# Patient Record
Sex: Male | Born: 1960 | Race: Black or African American | Hispanic: No | Smoking: Never smoker
Health system: Southern US, Community
[De-identification: ages and names within clinical notes are randomized; demographics above are authoritative.]

## PROBLEM LIST (undated history)

## (undated) DIAGNOSIS — I10 Essential (primary) hypertension: Secondary | ICD-10-CM

---

## 1998-06-29 ENCOUNTER — Encounter: Payer: Self-pay | Admitting: Emergency Medicine

## 1998-06-29 ENCOUNTER — Emergency Department (HOSPITAL_COMMUNITY): Admission: EM | Admit: 1998-06-29 | Discharge: 1998-06-29 | Payer: Self-pay | Admitting: Emergency Medicine

## 2003-10-19 ENCOUNTER — Emergency Department (HOSPITAL_COMMUNITY): Admission: EM | Admit: 2003-10-19 | Discharge: 2003-10-19 | Payer: Self-pay | Admitting: Emergency Medicine

## 2007-03-02 ENCOUNTER — Emergency Department (HOSPITAL_COMMUNITY): Admission: EM | Admit: 2007-03-02 | Discharge: 2007-03-02 | Payer: Self-pay | Admitting: Emergency Medicine

## 2009-04-15 ENCOUNTER — Emergency Department (HOSPITAL_COMMUNITY): Admission: EM | Admit: 2009-04-15 | Discharge: 2009-04-15 | Payer: Self-pay | Admitting: Emergency Medicine

## 2011-05-08 DIAGNOSIS — J209 Acute bronchitis, unspecified: Secondary | ICD-10-CM | POA: Diagnosis not present

## 2011-05-08 DIAGNOSIS — R0989 Other specified symptoms and signs involving the circulatory and respiratory systems: Secondary | ICD-10-CM | POA: Diagnosis not present

## 2011-05-08 DIAGNOSIS — R05 Cough: Secondary | ICD-10-CM | POA: Diagnosis not present

## 2011-11-06 DIAGNOSIS — J40 Bronchitis, not specified as acute or chronic: Secondary | ICD-10-CM | POA: Diagnosis not present

## 2011-11-06 DIAGNOSIS — J209 Acute bronchitis, unspecified: Secondary | ICD-10-CM | POA: Diagnosis not present

## 2012-07-06 DIAGNOSIS — K3189 Other diseases of stomach and duodenum: Secondary | ICD-10-CM | POA: Diagnosis not present

## 2012-07-06 DIAGNOSIS — Z7982 Long term (current) use of aspirin: Secondary | ICD-10-CM | POA: Diagnosis not present

## 2012-07-30 DIAGNOSIS — K219 Gastro-esophageal reflux disease without esophagitis: Secondary | ICD-10-CM | POA: Diagnosis not present

## 2012-07-31 DIAGNOSIS — K219 Gastro-esophageal reflux disease without esophagitis: Secondary | ICD-10-CM | POA: Diagnosis not present

## 2012-08-14 DIAGNOSIS — I10 Essential (primary) hypertension: Secondary | ICD-10-CM | POA: Diagnosis not present

## 2012-08-14 DIAGNOSIS — R109 Unspecified abdominal pain: Secondary | ICD-10-CM | POA: Diagnosis not present

## 2012-08-14 DIAGNOSIS — Z125 Encounter for screening for malignant neoplasm of prostate: Secondary | ICD-10-CM | POA: Diagnosis not present

## 2012-09-05 DIAGNOSIS — H6120 Impacted cerumen, unspecified ear: Secondary | ICD-10-CM | POA: Diagnosis not present

## 2012-09-05 DIAGNOSIS — H9319 Tinnitus, unspecified ear: Secondary | ICD-10-CM | POA: Diagnosis not present

## 2012-09-12 DIAGNOSIS — K219 Gastro-esophageal reflux disease without esophagitis: Secondary | ICD-10-CM | POA: Diagnosis not present

## 2012-09-20 DIAGNOSIS — L570 Actinic keratosis: Secondary | ICD-10-CM | POA: Diagnosis not present

## 2012-09-20 DIAGNOSIS — L980 Pyogenic granuloma: Secondary | ICD-10-CM | POA: Diagnosis not present

## 2013-02-11 DIAGNOSIS — H60399 Other infective otitis externa, unspecified ear: Secondary | ICD-10-CM | POA: Diagnosis not present

## 2013-09-10 DIAGNOSIS — K2970 Gastritis, unspecified, without bleeding: Secondary | ICD-10-CM | POA: Diagnosis not present

## 2013-09-10 DIAGNOSIS — K219 Gastro-esophageal reflux disease without esophagitis: Secondary | ICD-10-CM | POA: Diagnosis not present

## 2013-10-08 DIAGNOSIS — R079 Chest pain, unspecified: Secondary | ICD-10-CM | POA: Diagnosis not present

## 2013-10-08 DIAGNOSIS — G8929 Other chronic pain: Secondary | ICD-10-CM | POA: Diagnosis not present

## 2013-10-08 DIAGNOSIS — Z7982 Long term (current) use of aspirin: Secondary | ICD-10-CM | POA: Diagnosis not present

## 2013-10-08 DIAGNOSIS — Z79899 Other long term (current) drug therapy: Secondary | ICD-10-CM | POA: Diagnosis not present

## 2013-10-08 DIAGNOSIS — M549 Dorsalgia, unspecified: Secondary | ICD-10-CM | POA: Diagnosis not present

## 2013-10-08 DIAGNOSIS — I209 Angina pectoris, unspecified: Secondary | ICD-10-CM | POA: Diagnosis not present

## 2013-10-08 DIAGNOSIS — K219 Gastro-esophageal reflux disease without esophagitis: Secondary | ICD-10-CM | POA: Diagnosis not present

## 2013-10-09 DIAGNOSIS — K219 Gastro-esophageal reflux disease without esophagitis: Secondary | ICD-10-CM | POA: Diagnosis not present

## 2013-10-09 DIAGNOSIS — R079 Chest pain, unspecified: Secondary | ICD-10-CM | POA: Diagnosis not present

## 2013-10-09 DIAGNOSIS — G8929 Other chronic pain: Secondary | ICD-10-CM | POA: Diagnosis not present

## 2013-10-09 DIAGNOSIS — M549 Dorsalgia, unspecified: Secondary | ICD-10-CM | POA: Diagnosis not present

## 2013-10-09 DIAGNOSIS — I209 Angina pectoris, unspecified: Secondary | ICD-10-CM | POA: Diagnosis not present

## 2013-11-20 DIAGNOSIS — R079 Chest pain, unspecified: Secondary | ICD-10-CM | POA: Diagnosis not present

## 2013-11-20 DIAGNOSIS — Z7982 Long term (current) use of aspirin: Secondary | ICD-10-CM | POA: Diagnosis not present

## 2013-11-20 DIAGNOSIS — R0789 Other chest pain: Secondary | ICD-10-CM | POA: Diagnosis not present

## 2013-12-10 DIAGNOSIS — E782 Mixed hyperlipidemia: Secondary | ICD-10-CM | POA: Diagnosis not present

## 2013-12-10 DIAGNOSIS — I674 Hypertensive encephalopathy: Secondary | ICD-10-CM | POA: Diagnosis not present

## 2013-12-10 DIAGNOSIS — Z131 Encounter for screening for diabetes mellitus: Secondary | ICD-10-CM | POA: Diagnosis not present

## 2013-12-26 DIAGNOSIS — R1013 Epigastric pain: Secondary | ICD-10-CM | POA: Diagnosis not present

## 2013-12-26 DIAGNOSIS — R0789 Other chest pain: Secondary | ICD-10-CM | POA: Diagnosis not present

## 2014-02-10 ENCOUNTER — Encounter (HOSPITAL_COMMUNITY): Payer: Self-pay | Admitting: *Deleted

## 2014-02-10 ENCOUNTER — Emergency Department (HOSPITAL_COMMUNITY)
Admission: EM | Admit: 2014-02-10 | Discharge: 2014-02-11 | Disposition: A | Discharge status: Home / Self Care | Payer: Medicare Other | Attending: Emergency Medicine | Admitting: Emergency Medicine

## 2014-02-10 DIAGNOSIS — M549 Dorsalgia, unspecified: Secondary | ICD-10-CM

## 2014-02-10 DIAGNOSIS — I10 Essential (primary) hypertension: Secondary | ICD-10-CM | POA: Diagnosis not present

## 2014-02-10 DIAGNOSIS — I2510 Atherosclerotic heart disease of native coronary artery without angina pectoris: Secondary | ICD-10-CM | POA: Diagnosis not present

## 2014-02-10 DIAGNOSIS — M545 Low back pain: Secondary | ICD-10-CM | POA: Insufficient documentation

## 2014-02-10 DIAGNOSIS — R002 Palpitations: Secondary | ICD-10-CM | POA: Diagnosis not present

## 2014-02-10 DIAGNOSIS — N200 Calculus of kidney: Secondary | ICD-10-CM | POA: Diagnosis not present

## 2014-02-10 DIAGNOSIS — R0789 Other chest pain: Secondary | ICD-10-CM | POA: Diagnosis not present

## 2014-02-10 DIAGNOSIS — R109 Unspecified abdominal pain: Secondary | ICD-10-CM | POA: Insufficient documentation

## 2014-02-10 DIAGNOSIS — I700 Atherosclerosis of aorta: Secondary | ICD-10-CM | POA: Diagnosis not present

## 2014-02-10 DIAGNOSIS — R079 Chest pain, unspecified: Secondary | ICD-10-CM | POA: Diagnosis not present

## 2014-02-10 DIAGNOSIS — K5730 Diverticulosis of large intestine without perforation or abscess without bleeding: Secondary | ICD-10-CM | POA: Diagnosis not present

## 2014-02-10 HISTORY — DX: Essential (primary) hypertension: I10

## 2014-02-10 LAB — COMPREHENSIVE METABOLIC PANEL
ALK PHOS: 53 U/L (ref 39–117)
ALT: 19 U/L (ref 0–53)
AST: 25 U/L (ref 0–37)
Albumin: 4.2 g/dL (ref 3.5–5.2)
Anion gap: 9 (ref 5–15)
BUN: 17 mg/dL (ref 6–23)
CHLORIDE: 102 mmol/L (ref 96–112)
CO2: 27 mmol/L (ref 19–32)
Calcium: 9.1 mg/dL (ref 8.4–10.5)
Creatinine, Ser: 1.44 mg/dL — ABNORMAL HIGH (ref 0.50–1.35)
GFR calc Af Amer: 62 mL/min — ABNORMAL LOW (ref >90)
GFR calc non Af Amer: 54 mL/min — ABNORMAL LOW (ref >90)
Glucose, Bld: 99 mg/dL (ref 70–99)
POTASSIUM: 4 mmol/L (ref 3.5–5.1)
Sodium: 138 mmol/L (ref 135–145)
Total Bilirubin: <0.1 mg/dL — ABNORMAL LOW (ref 0.3–1.2)
Total Protein: 7.5 g/dL (ref 6.0–8.3)

## 2014-02-10 LAB — CBC WITH DIFFERENTIAL/PLATELET
Basophils Absolute: 0 10*3/uL (ref 0.0–0.1)
Basophils Relative: 0 % (ref 0–1)
Eosinophils Absolute: 0.1 10*3/uL (ref 0.0–0.7)
Eosinophils Relative: 1 % (ref 0–5)
HCT: 43.4 % (ref 39.0–52.0)
Hemoglobin: 14.7 g/dL (ref 13.0–17.0)
Lymphocytes Relative: 27 % (ref 12–46)
Lymphs Abs: 2 10*3/uL (ref 0.7–4.0)
MCH: 30.1 pg (ref 26.0–34.0)
MCHC: 33.9 g/dL (ref 30.0–36.0)
MCV: 88.9 fL (ref 78.0–100.0)
Monocytes Absolute: 0.5 10*3/uL (ref 0.1–1.0)
Monocytes Relative: 6 % (ref 3–12)
Neutro Abs: 4.8 10*3/uL (ref 1.7–7.7)
Neutrophils Relative %: 66 % (ref 43–77)
Platelets: 239 10*3/uL (ref 150–400)
RBC: 4.88 MIL/uL (ref 4.22–5.81)
RDW: 12.4 % (ref 11.5–15.5)
WBC: 7.3 10*3/uL (ref 4.0–10.5)

## 2014-02-10 LAB — LIPASE, BLOOD: LIPASE: 43 U/L (ref 11–59)

## 2014-02-10 MED ORDER — ASPIRIN 325 MG PO TABS
325.0000 mg | ORAL_TABLET | Freq: Once | ORAL | Status: AC
  Administered 2014-02-11: 325 mg via ORAL
  Filled 2014-02-10: qty 1

## 2014-02-10 NOTE — ED Notes (Addendum)
Pt in c/o mid to lower back pain for the last few weeks, worse with movement or walking, also c/o generalized abd pain, reports burning sensation, worse after eating, no distress noted- pt states he has been off of his BP medication for the last month

## 2014-02-10 NOTE — ED Provider Notes (Signed)
CSN: [id number]     Arrival date & time 02/10/14  2142 History   First MD Initiated Contact with Patient 02/10/14 2329     Chief Complaint  Patient presents with  . Back Pain  . Abdominal Pain     (Consider location/radiation/quality/duration/timing/severity/associated sxs/prior Treatment) HPI Comments: Patient reporting multiple complaints including one month of bilateral flank pain with radiation to abdomen, described as a tightening lasting about 30 seconds at a time.  He also complains of about a month of palpitations with intermittent left-sided chest pain without radiation or associated symptoms.  He denies fevers, chills, cough, shortness of breath, leg pain or swelling.  He has no current chest pain or abdominal/back pain   Past Medical History  Diagnosis Date  . Hypertension    History reviewed. No pertinent past surgical history. History reviewed. No pertinent family history. History  Substance Use Topics  . Smoking status: Never Smoker   . Smokeless tobacco: Not on file  . Alcohol Use: Not on file    Review of Systems  Constitutional: Negative for fever, activity change, appetite change and fatigue.  HENT: Negative for congestion, facial swelling, rhinorrhea and trouble swallowing.   Eyes: Negative for photophobia and pain.  Respiratory: Negative for cough, chest tightness and shortness of breath.   Cardiovascular: Positive for chest pain. Negative for leg swelling.  Gastrointestinal: Positive for abdominal pain. Negative for nausea, vomiting, diarrhea and constipation.  Endocrine: Negative for polydipsia and polyuria.  Genitourinary: Negative for dysuria, urgency, decreased urine volume and difficulty urinating.  Musculoskeletal: Negative for back pain and gait problem.  Skin: Negative for color change, rash and wound.  Allergic/Immunologic: Negative for immunocompromised state.  Neurological: Negative for dizziness, facial asymmetry, speech difficulty, weakness,  numbness and headaches.  Psychiatric/Behavioral: Negative for confusion, decreased concentration and agitation.      Allergies  Review of patient's allergies indicates no known allergies.  Home Medications   Prior to Admission medications   Not on File   BP 145/81 mmHg  Pulse 70  Temp(Src) 98.1 F (36.7 C) (Oral)  Resp 13  SpO2 100% Physical Exam  Constitutional: He is oriented to person, place, and time. He appears well-developed and well-nourished. No distress.  HENT:  Head: Normocephalic and atraumatic.  Mouth/Throat: No oropharyngeal exudate.  Eyes: Pupils are equal, round, and reactive to light.  Neck: Normal range of motion. Neck supple.  Cardiovascular: Normal rate, regular rhythm and normal heart sounds.  Exam reveals no gallop and no friction rub.   No murmur heard. Pulmonary/Chest: Effort normal and breath sounds normal. No respiratory distress. He has no wheezes. He has no rales.  Abdominal: Soft. Bowel sounds are normal. He exhibits no distension and no mass. There is no tenderness. There is no rebound and no guarding.  Musculoskeletal: Normal range of motion. He exhibits no edema or tenderness.  Neurological: He is alert and oriented to person, place, and time.  Skin: Skin is warm and dry.  Psychiatric: He has a normal mood and affect.    ED Course  Procedures (including critical care time) Labs Review Labs Reviewed  COMPREHENSIVE METABOLIC PANEL - Abnormal; Notable for the following:    Creatinine, Ser 1.44 (*)    Total Bilirubin <0.1 (*)    GFR calc non Af Amer 54 (*)    GFR calc Af Amer 62 (*)    All other components within normal limits  URINALYSIS, ROUTINE W REFLEX MICROSCOPIC - Abnormal; Notable for the following:    APPearance  CLOUDY (*)    All other components within normal limits  URINE CULTURE  CBC WITH DIFFERENTIAL/PLATELET  LIPASE, BLOOD  I-STAT TROPOININ, ED    Imaging Review Dg Chest 2 View  02/11/2014   CLINICAL DATA:  Chronic  left-sided chest pain for 1 month, with back and abdominal pain. Initial encounter.  EXAM: CHEST  2 VIEW  COMPARISON:  Chest radiograph performed 11/20/2013  FINDINGS: The lungs are well-aerated and clear. There is no evidence of focal opacification, pleural effusion or pneumothorax.  The heart is normal in size; the mediastinal contour is within normal limits. No acute osseous abnormalities are seen. The patient is status post right-sided rotator cuff repair.  IMPRESSION: No acute cardiopulmonary process seen.   Electronically Signed   By: Roanna RaiderJeffery  Chang M.D.   On: 02/11/2014 00:46   Ct Renal Stone Study  02/11/2014   CLINICAL DATA:  Chronic onset of bilateral flank and bilateral upper quadrant abdominal pain. Diarrhea. Initial encounter.  EXAM: CT ABDOMEN AND PELVIS WITHOUT CONTRAST  TECHNIQUE: Multidetector CT imaging of the abdomen and pelvis was performed following the standard protocol without IV contrast.  COMPARISON:  None.  FINDINGS: The visualized lung bases are clear. Scattered coronary artery calcifications are seen.  The liver and spleen are unremarkable in appearance. The gallbladder is within normal limits. The pancreas and adrenal glands are unremarkable.  Scattered bilateral renal stones are seen, measuring up to 4 mm in size. There is no evidence of an obstructing ureteral stone. No hydronephrosis is seen. Mild nonspecific perinephric stranding is noted bilaterally.  No free fluid is identified. The small bowel is unremarkable in appearance. The stomach is within normal limits. No acute vascular abnormalities are seen. Mild scattered calcification is seen along the abdominal aorta and its branches.  The appendix is normal in caliber and contains air, without evidence for appendicitis. Scattered diverticulosis is noted along the descending and proximal sigmoid colon, without evidence of diverticulitis.  The bladder is mildly distended and grossly unremarkable. The prostate is normal in size. No  inguinal lymphadenopathy is seen.  No acute osseous abnormalities are identified.  IMPRESSION: 1. No acute abnormality seen within the abdomen or pelvis. 2. Nonobstructing bilateral renal stones, measuring up to 4 mm in size. No evidence of hydronephrosis. 3. Scattered coronary artery calcifications seen. 4. Mild scattered calcification along the abdominal aorta and its branches. 5. Scattered diverticulosis along the descending and proximal sigmoid colon, without evidence of diverticulitis.   Electronically Signed   By: Roanna RaiderJeffery  Chang M.D.   On: 02/11/2014 00:33     EKG Interpretation   Date/Time:  Tuesday February 11 2014 00:30:04 EST Ventricular Rate:  79 PR Interval:  216 QRS Duration: 87 QT Interval:  380 QTC Calculation: 436 R Axis:   58 Text Interpretation:  Sinus rhythm First degree A-V block No prior for  comparison Confirmed by DOCHERTY  MD, MEGAN ([id number]) on 02/11/2014 12:32:42 AM      MDM   Final diagnoses:  Back pain  Palpitations  Atypical chest pain    Pt is a 54 y.o. male with Pmhx as above who presents with  Multiple complaints including several weeks of bilateral low back pain with radiation to the abdomen as well as one month of palpitations and intermittent left-sided chest pain without radiation.  Aggravating or alleviating symptoms.  Patient asking for a full body MRI.  On physical exam, vital signs are stable and he is in no acute distress.  Abdominal exam is benign.  No CVA tenderness.  Current pulmonary exam is benign.  No lower extremity edema.  EKG with no acute ischemic changes , troponin negative.  Chest x-ray normal.  Creatinine mildly elevated 1.44, although I have no priors.  Given creatinine elevation with report of flank pain rating to abdomen.  CT stone study ordered and was without acute findings to explain patient's symptoms.  The patient is safe to continue outpatient follow-up with his PCP    Johny Sax evaluation in the Emergency Department is  complete. It has been determined that no acute conditions requiring further emergency intervention are present at this time. The patient/guardian have been advised of the diagnosis and plan. We have discussed signs and symptoms that warrant return to the ED, such as changes or worsening in symptoms, worsening pain, shortness of breath, fevers      Toy Cookey, MD 02/11/14 [phone]

## 2014-02-11 ENCOUNTER — Emergency Department (HOSPITAL_COMMUNITY): Payer: Medicare Other

## 2014-02-11 ENCOUNTER — Encounter (HOSPITAL_COMMUNITY): Payer: Self-pay

## 2014-02-11 DIAGNOSIS — M549 Dorsalgia, unspecified: Secondary | ICD-10-CM | POA: Diagnosis not present

## 2014-02-11 DIAGNOSIS — K5730 Diverticulosis of large intestine without perforation or abscess without bleeding: Secondary | ICD-10-CM | POA: Diagnosis not present

## 2014-02-11 DIAGNOSIS — I2510 Atherosclerotic heart disease of native coronary artery without angina pectoris: Secondary | ICD-10-CM | POA: Diagnosis not present

## 2014-02-11 DIAGNOSIS — N200 Calculus of kidney: Secondary | ICD-10-CM | POA: Diagnosis not present

## 2014-02-11 DIAGNOSIS — I700 Atherosclerosis of aorta: Secondary | ICD-10-CM | POA: Diagnosis not present

## 2014-02-11 DIAGNOSIS — R079 Chest pain, unspecified: Secondary | ICD-10-CM | POA: Diagnosis not present

## 2014-02-11 DIAGNOSIS — R109 Unspecified abdominal pain: Secondary | ICD-10-CM | POA: Diagnosis not present

## 2014-02-11 LAB — URINE CULTURE
COLONY COUNT: NO GROWTH
Culture: NO GROWTH

## 2014-02-11 LAB — URINALYSIS, ROUTINE W REFLEX MICROSCOPIC
BILIRUBIN URINE: NEGATIVE
GLUCOSE, UA: NEGATIVE mg/dL
HGB URINE DIPSTICK: NEGATIVE
Ketones, ur: NEGATIVE mg/dL
Leukocytes, UA: NEGATIVE
NITRITE: NEGATIVE
PH: 6 (ref 5.0–8.0)
PROTEIN: NEGATIVE mg/dL
Specific Gravity, Urine: 1.016 (ref 1.005–1.030)
Urobilinogen, UA: 0.2 mg/dL (ref 0.0–1.0)

## 2014-02-11 LAB — I-STAT TROPONIN, ED: Troponin i, poc: 0.01 ng/mL (ref 0.00–0.08)

## 2014-02-11 NOTE — ED Notes (Signed)
Pt made aware to return if symptoms worsen or if any life threatening symptoms occur.   

## 2014-02-11 NOTE — ED Notes (Signed)
Patient transported to CT 

## 2014-02-11 NOTE — Discharge Instructions (Signed)
Back Pain, Adult Back pain is very common. The pain often gets better over time. The cause of back pain is usually not dangerous. Most people can learn to manage their back pain on their own.  HOME CARE   Stay active. Start with short walks on flat ground if you can. Try to walk farther each day.  Do not sit, drive, or stand in one place for more than 30 minutes. Do not stay in bed.  Do not avoid exercise or work. Activity can help your back heal faster.  Be careful when you bend or lift an object. Bend at your knees, keep the object close to you, and do not twist.  Sleep on a firm mattress. Lie on your side, and bend your knees. If you lie on your back, put a pillow under your knees.  Only take medicines as told by your doctor.  Put ice on the injured area.  Put ice in a plastic bag.  Place a towel between your skin and the bag.  Leave the ice on for 15-20 minutes, 03-04 times a day for the first 2 to 3 days. After that, you can switch between ice and heat packs.  Ask your doctor about back exercises or massage.  Avoid feeling anxious or stressed. Find good ways to deal with stress, such as exercise. GET HELP RIGHT AWAY IF:   Your pain does not go away with rest or medicine.  Your pain does not go away in 1 week.  You have new problems.  You do not feel well.  The pain spreads into your legs.  You cannot control when you poop (bowel movement) or pee (urinate).  Your arms or legs feel weak or lose feeling (numbness).  You feel sick to your stomach (nauseous) or throw up (vomit).  You have belly (abdominal) pain.  You feel like you may pass out (faint). MAKE SURE YOU:   Understand these instructions.  Will watch your condition.  Will get help right away if you are not doing well or get worse. Document Released: 06/15/2007 Document Revised: 03/21/2011 Document Reviewed: 04/30/2013 [facility] Patient Information 2015 [place], [place]. This information is not intended  to replace advice given to you by your health care provider. Make sure you discuss any questions you have with your health care provider.  Palpitations A palpitation is the feeling that your heartbeat is irregular or is faster than normal. It may feel like your heart is fluttering or skipping a beat. Palpitations are usually not a serious problem. However, in some cases, you may need further medical evaluation. CAUSES  Palpitations can be caused by:  Smoking.  Caffeine or other stimulants, such as diet pills or energy drinks.  Alcohol.  Stress and anxiety.  Strenuous physical activity.  Fatigue.  Certain medicines.  Heart disease, especially if you have a history of irregular heart rhythms (arrhythmias), such as atrial fibrillation, atrial flutter, or supraventricular tachycardia.  An improperly working pacemaker or defibrillator. DIAGNOSIS  To find the cause of your palpitations, your health care provider will take your medical history and perform a physical exam. Your health care provider may also have you take a test called an ambulatory electrocardiogram (ECG). An ECG records your heartbeat patterns over a 24-hour period. You may also have other tests, such as:  Transthoracic echocardiogram (TTE). During echocardiography, sound waves are used to evaluate how blood flows through your heart.  Transesophageal echocardiogram (TEE).  Cardiac monitoring. This allows your health care provider to monitor  your heart rate and rhythm in real time.  Holter monitor. This is a portable device that records your heartbeat and can help diagnose heart arrhythmias. It allows your health care provider to track your heart activity for several days, if needed.  Stress tests by exercise or by giving medicine that makes the heart beat faster. TREATMENT  Treatment of palpitations depends on the cause of your symptoms and can vary greatly. Most cases of palpitations do not require any treatment other  than time, relaxation, and monitoring your symptoms. Other causes, such as atrial fibrillation, atrial flutter, or supraventricular tachycardia, usually require further treatment. HOME CARE INSTRUCTIONS   Avoid:  Caffeinated coffee, tea, soft drinks, diet pills, and energy drinks.  Chocolate.  Alcohol.  Stop smoking if you smoke.  Reduce your stress and anxiety. Things that can help you relax include:  A method of controlling things in your body, such as your heartbeats, with your mind (biofeedback).  Yoga.  Meditation.  Physical activity such as swimming, jogging, or walking.  Get plenty of rest and sleep. SEEK MEDICAL CARE IF:   You continue to have a fast or irregular heartbeat beyond 24 hours.  Your palpitations occur more often. SEEK IMMEDIATE MEDICAL CARE IF:  You have chest pain or shortness of breath.  You have a severe headache.  You feel dizzy or you faint. MAKE SURE YOU:  Understand these instructions.  Will watch your condition.  Will get help right away if you are not doing well or get worse. Document Released: 12/25/1999 Document Revised: 01/01/2013 Document Reviewed: 02/25/2011 [facility] Patient Information 2015 [place]. This information is not intended to replace advice given to you by your health care provider. Make sure you discuss any questions you have with your health care provider.

## 2014-05-27 DIAGNOSIS — I10 Essential (primary) hypertension: Secondary | ICD-10-CM | POA: Diagnosis not present

## 2014-05-27 DIAGNOSIS — Z0000 Encounter for general adult medical examination without abnormal findings: Secondary | ICD-10-CM | POA: Diagnosis not present

## 2014-05-27 DIAGNOSIS — Z131 Encounter for screening for diabetes mellitus: Secondary | ICD-10-CM | POA: Diagnosis not present

## 2014-05-27 DIAGNOSIS — Z125 Encounter for screening for malignant neoplasm of prostate: Secondary | ICD-10-CM | POA: Diagnosis not present

## 2014-07-07 DIAGNOSIS — R194 Change in bowel habit: Secondary | ICD-10-CM | POA: Diagnosis not present

## 2015-04-02 DIAGNOSIS — Z7982 Long term (current) use of aspirin: Secondary | ICD-10-CM | POA: Diagnosis not present

## 2015-04-02 DIAGNOSIS — J039 Acute tonsillitis, unspecified: Secondary | ICD-10-CM | POA: Diagnosis not present

## 2015-05-01 DIAGNOSIS — I10 Essential (primary) hypertension: Secondary | ICD-10-CM | POA: Diagnosis not present

## 2015-05-01 DIAGNOSIS — R140 Abdominal distension (gaseous): Secondary | ICD-10-CM | POA: Diagnosis not present

## 2015-08-30 DIAGNOSIS — J309 Allergic rhinitis, unspecified: Secondary | ICD-10-CM | POA: Diagnosis not present

## 2015-08-30 DIAGNOSIS — R05 Cough: Secondary | ICD-10-CM | POA: Diagnosis not present

## 2015-08-30 DIAGNOSIS — J40 Bronchitis, not specified as acute or chronic: Secondary | ICD-10-CM | POA: Diagnosis not present

## 2015-08-30 DIAGNOSIS — J302 Other seasonal allergic rhinitis: Secondary | ICD-10-CM | POA: Diagnosis not present

## 2016-01-17 IMAGING — CT CT RENAL STONE PROTOCOL
2 of 4 series · 15 of 46 positions shown, 17 images · non-contrast
Comparison: None.

CLINICAL DATA: Chronic onset of bilateral flank and bilateral upper
quadrant abdominal pain. Diarrhea. Initial encounter.

EXAM:
CT ABDOMEN AND PELVIS WITHOUT CONTRAST
TECHNIQUE: Multidetector CT imaging of the abdomen and pelvis was performed
following the standard protocol without IV contrast.

[Series 2: stone study 5.0 i30f 1 · axial · 0.80mm/px · z∈[-474,-19]mm · 12 of 101 slices shown, 14 images]
[im 5/101  soft-tissue]
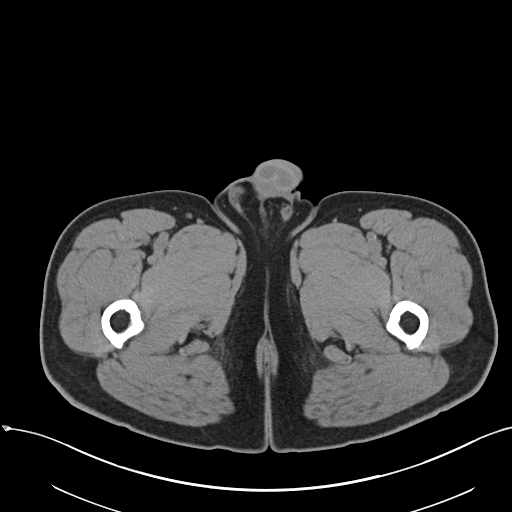
[im 5/101  bone]
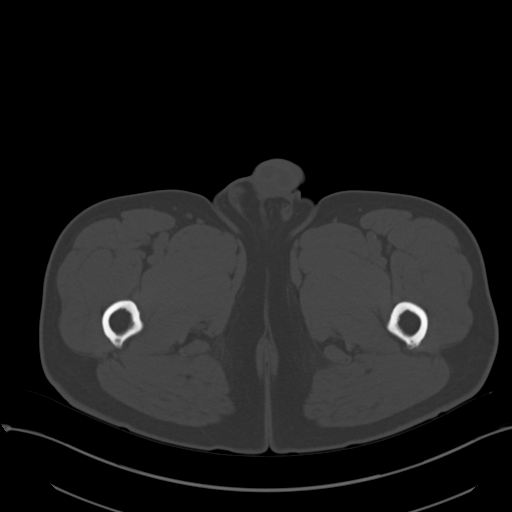
[im 13/101  soft-tissue]
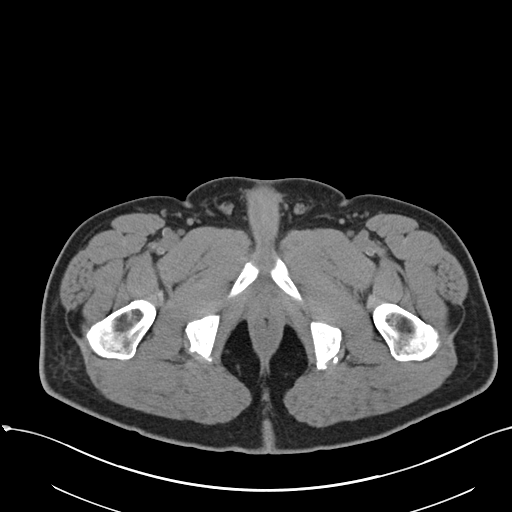
[im 21/101  soft-tissue]
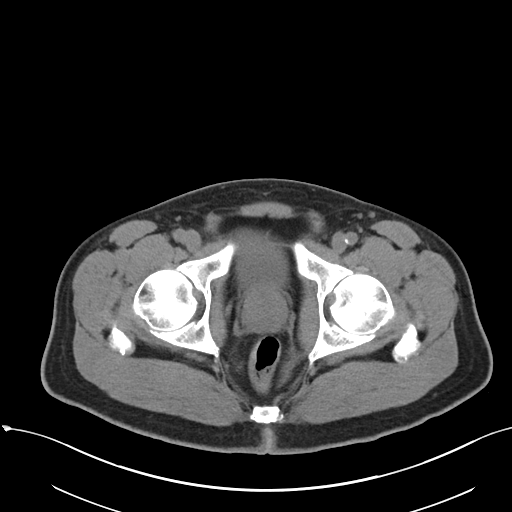
[im 30/101  soft-tissue]
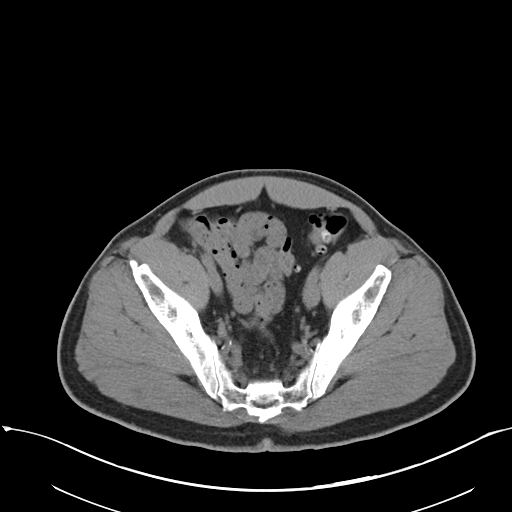
[im 38/101  soft-tissue]
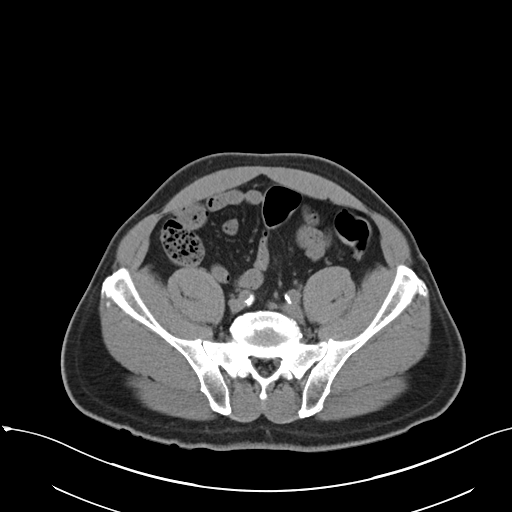
[im 46/101  soft-tissue]
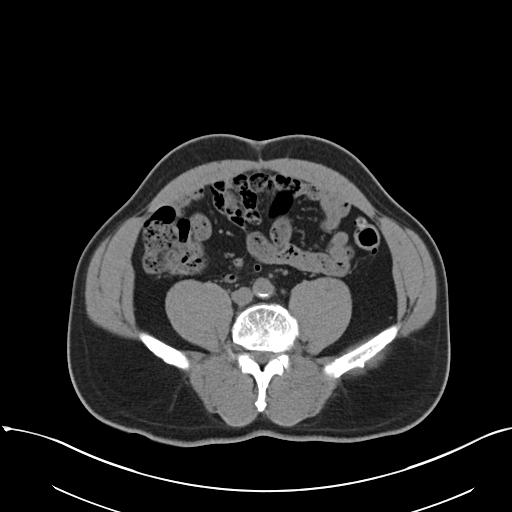
[im 55/101  soft-tissue]
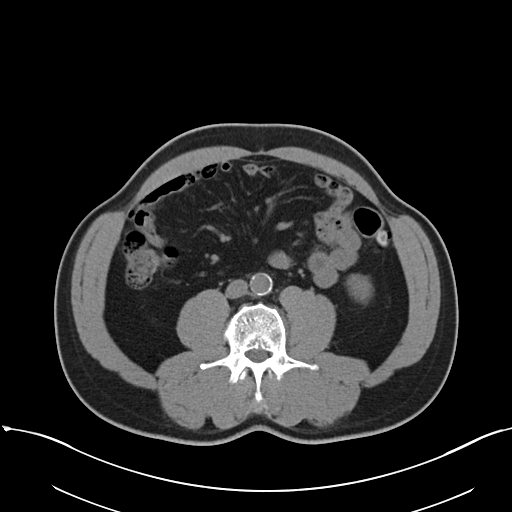
[im 63/101  soft-tissue]
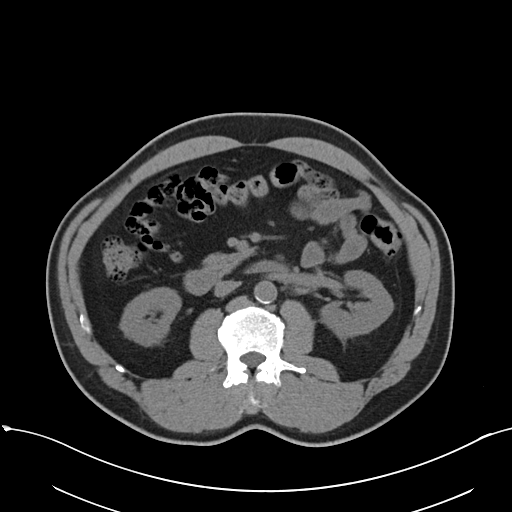
[im 71/101  soft-tissue]
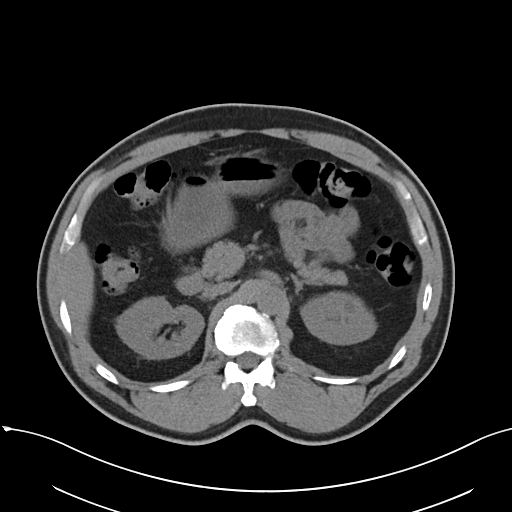
[im 71/101  bone]
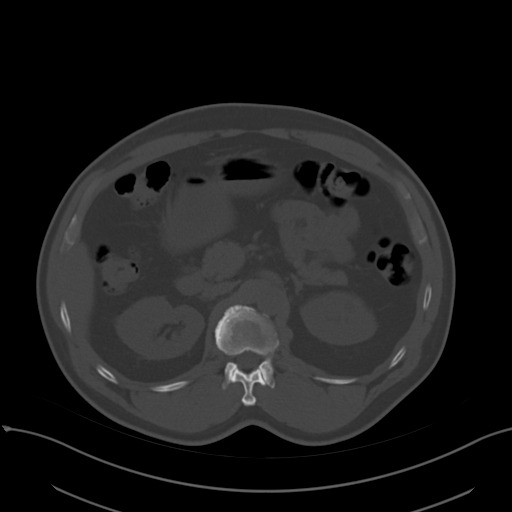
[im 80/101  soft-tissue]
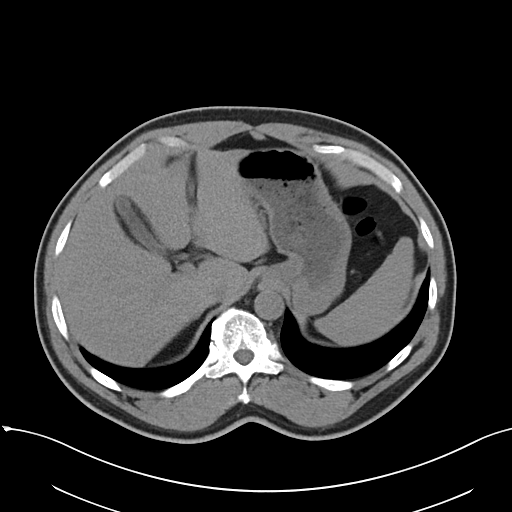
[im 88/101  soft-tissue]
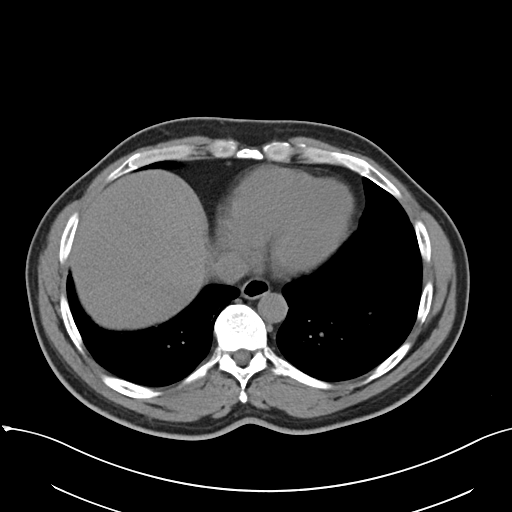
[im 96/101  soft-tissue]
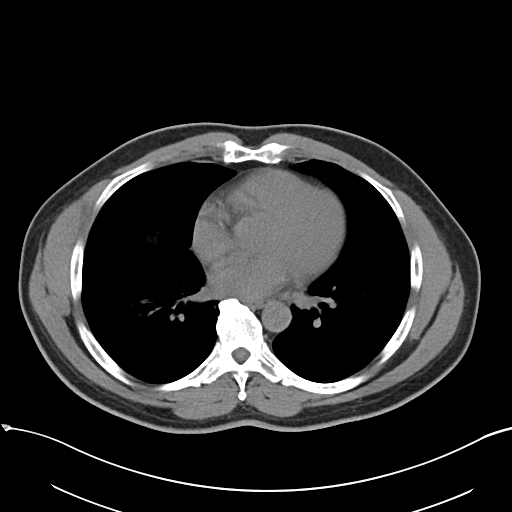

[Series 5: coronal soft tissue · coronal · 0.71mm/px · 3 of 85 slices shown]
[im 29/85  soft-tissue]
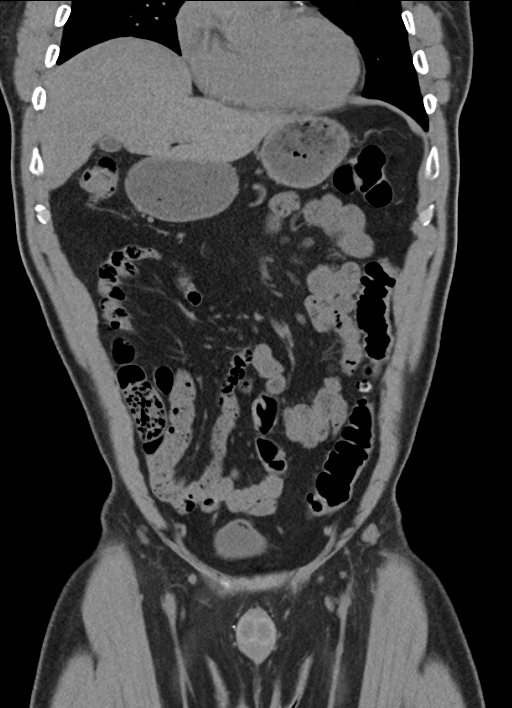
[im 38/85  soft-tissue]
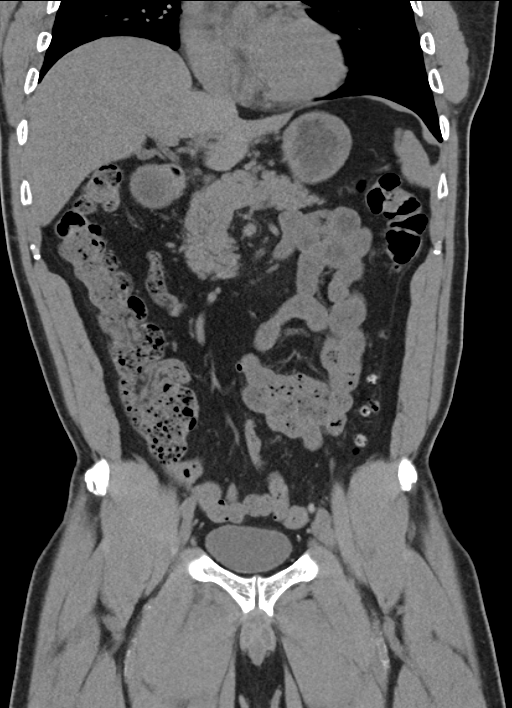
[im 47/85  soft-tissue]
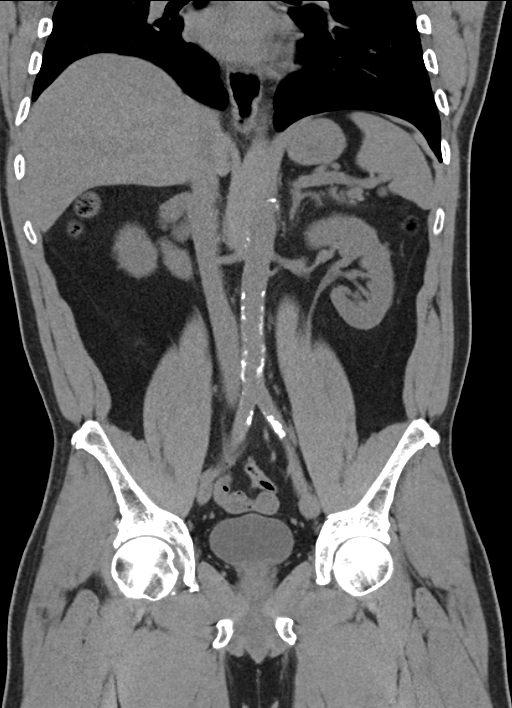

[15 of 46 positions shown; findings below may reference images not displayed]

FINDINGS: The visualized lung bases are clear. Scattered coronary artery
calcifications are seen.

The liver and spleen are unremarkable in appearance. The gallbladder
is within normal limits. The pancreas and adrenal glands are
unremarkable.

Scattered bilateral renal stones are seen, measuring up to 4 mm in
size. There is no evidence of an obstructing ureteral stone. No
hydronephrosis is seen. Mild nonspecific perinephric stranding is
noted bilaterally.

No free fluid is identified. The small bowel is unremarkable in
appearance. The stomach is within normal limits. No acute vascular
abnormalities are seen. Mild scattered calcification is seen along
the abdominal aorta and its branches.

The appendix is normal in caliber and contains air, without evidence
for appendicitis. Scattered diverticulosis is noted along the
descending and proximal sigmoid colon, without evidence of
diverticulitis.

The bladder is mildly distended and grossly unremarkable. The
prostate is normal in size. No inguinal lymphadenopathy is seen.

No acute osseous abnormalities are identified.
IMPRESSION: 1. No acute abnormality seen within the abdomen or pelvis.
2. Nonobstructing bilateral renal stones, measuring up to 4 mm in
size. No evidence of hydronephrosis.
3. Scattered coronary artery calcifications seen.
4. Mild scattered calcification along the abdominal aorta and its
branches.
5. Scattered diverticulosis along the descending and proximal
sigmoid colon, without evidence of diverticulitis.

## 2016-01-17 IMAGING — DX DG CHEST 2V
2 series · 2 of 2 positions shown · non-contrast
Comparison: Chest radiograph performed 11/20/2013

CLINICAL DATA: Chronic left-sided chest pain for 1 month, with back
and abdominal pain. Initial encounter.

EXAM:
CHEST  2 VIEW

[chest pa]
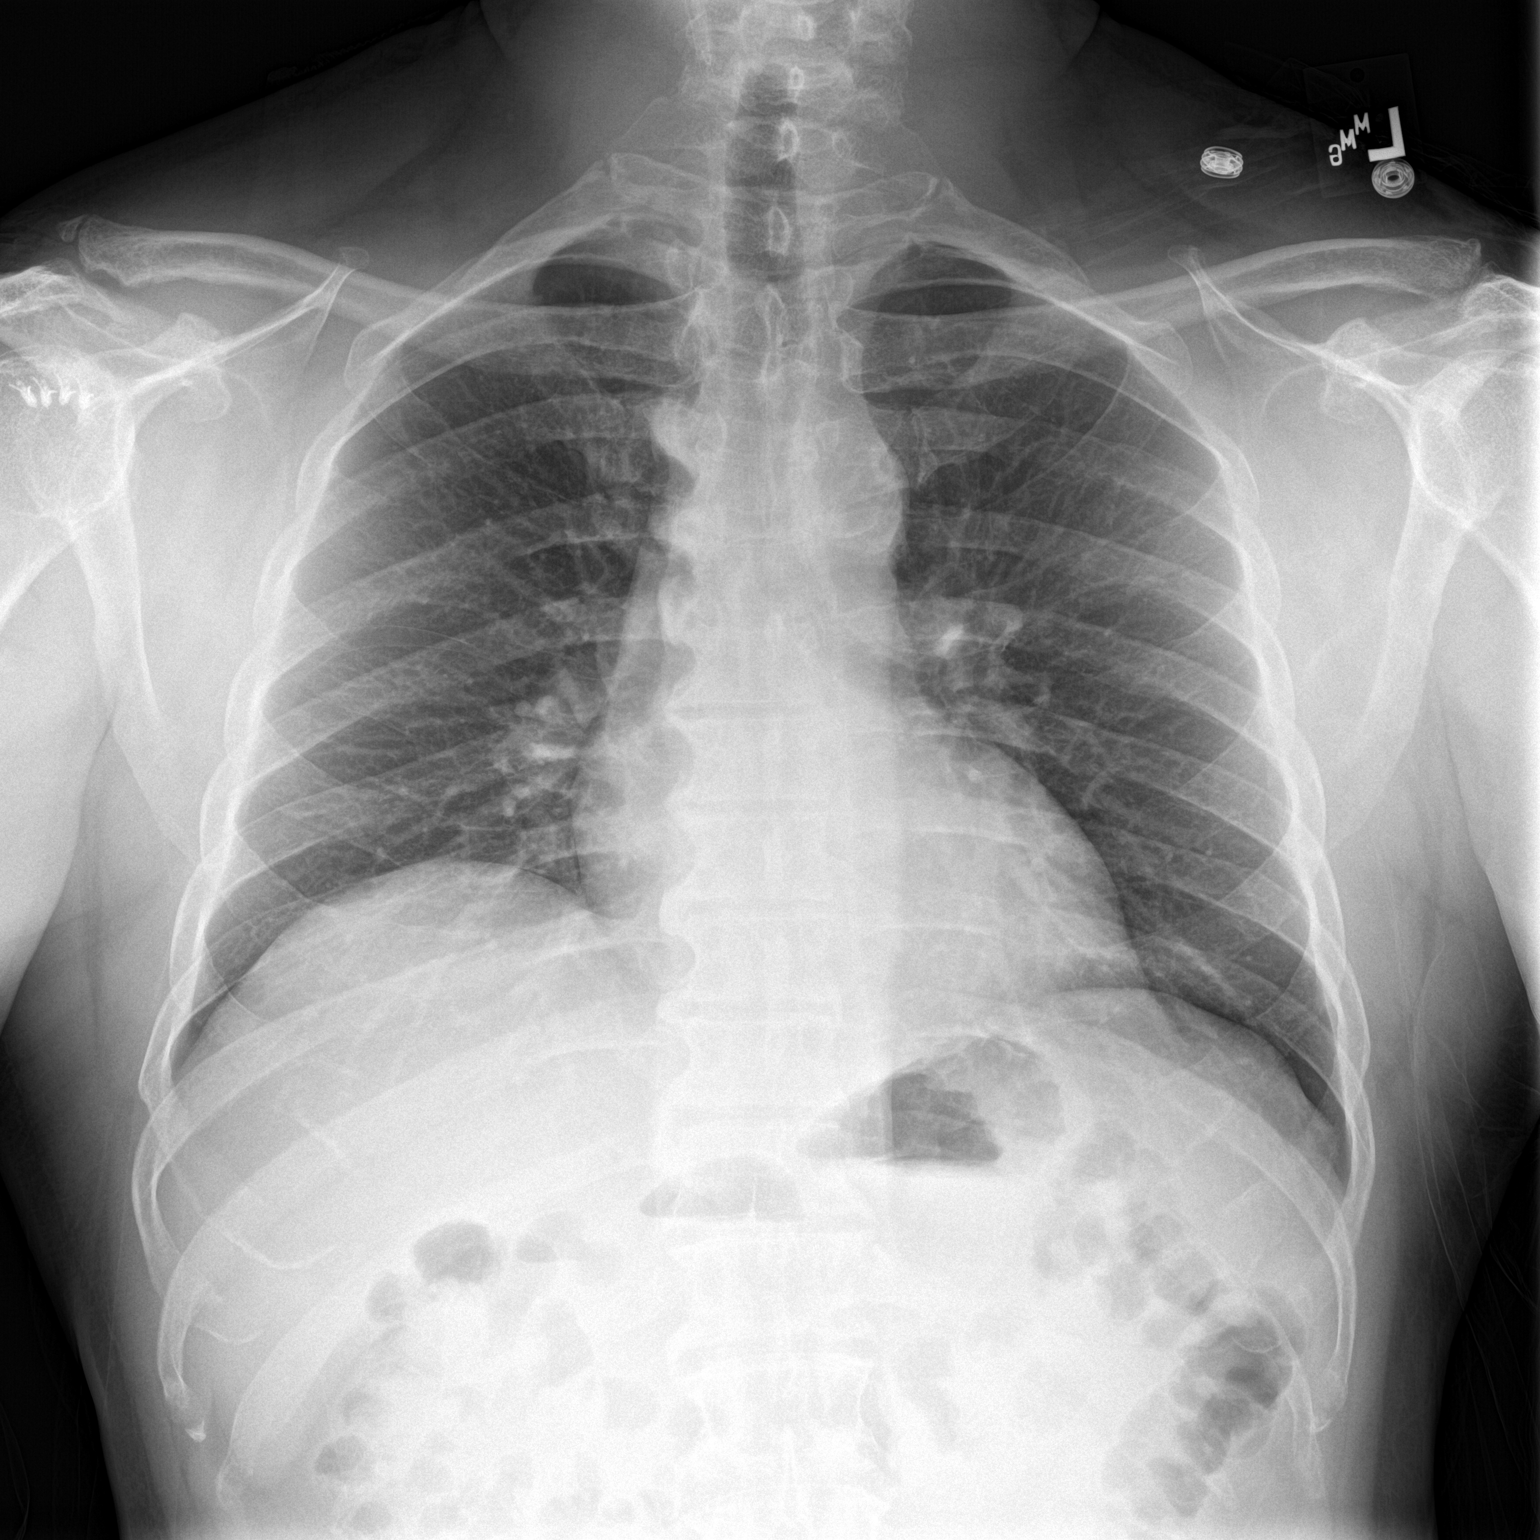

[chest lat]
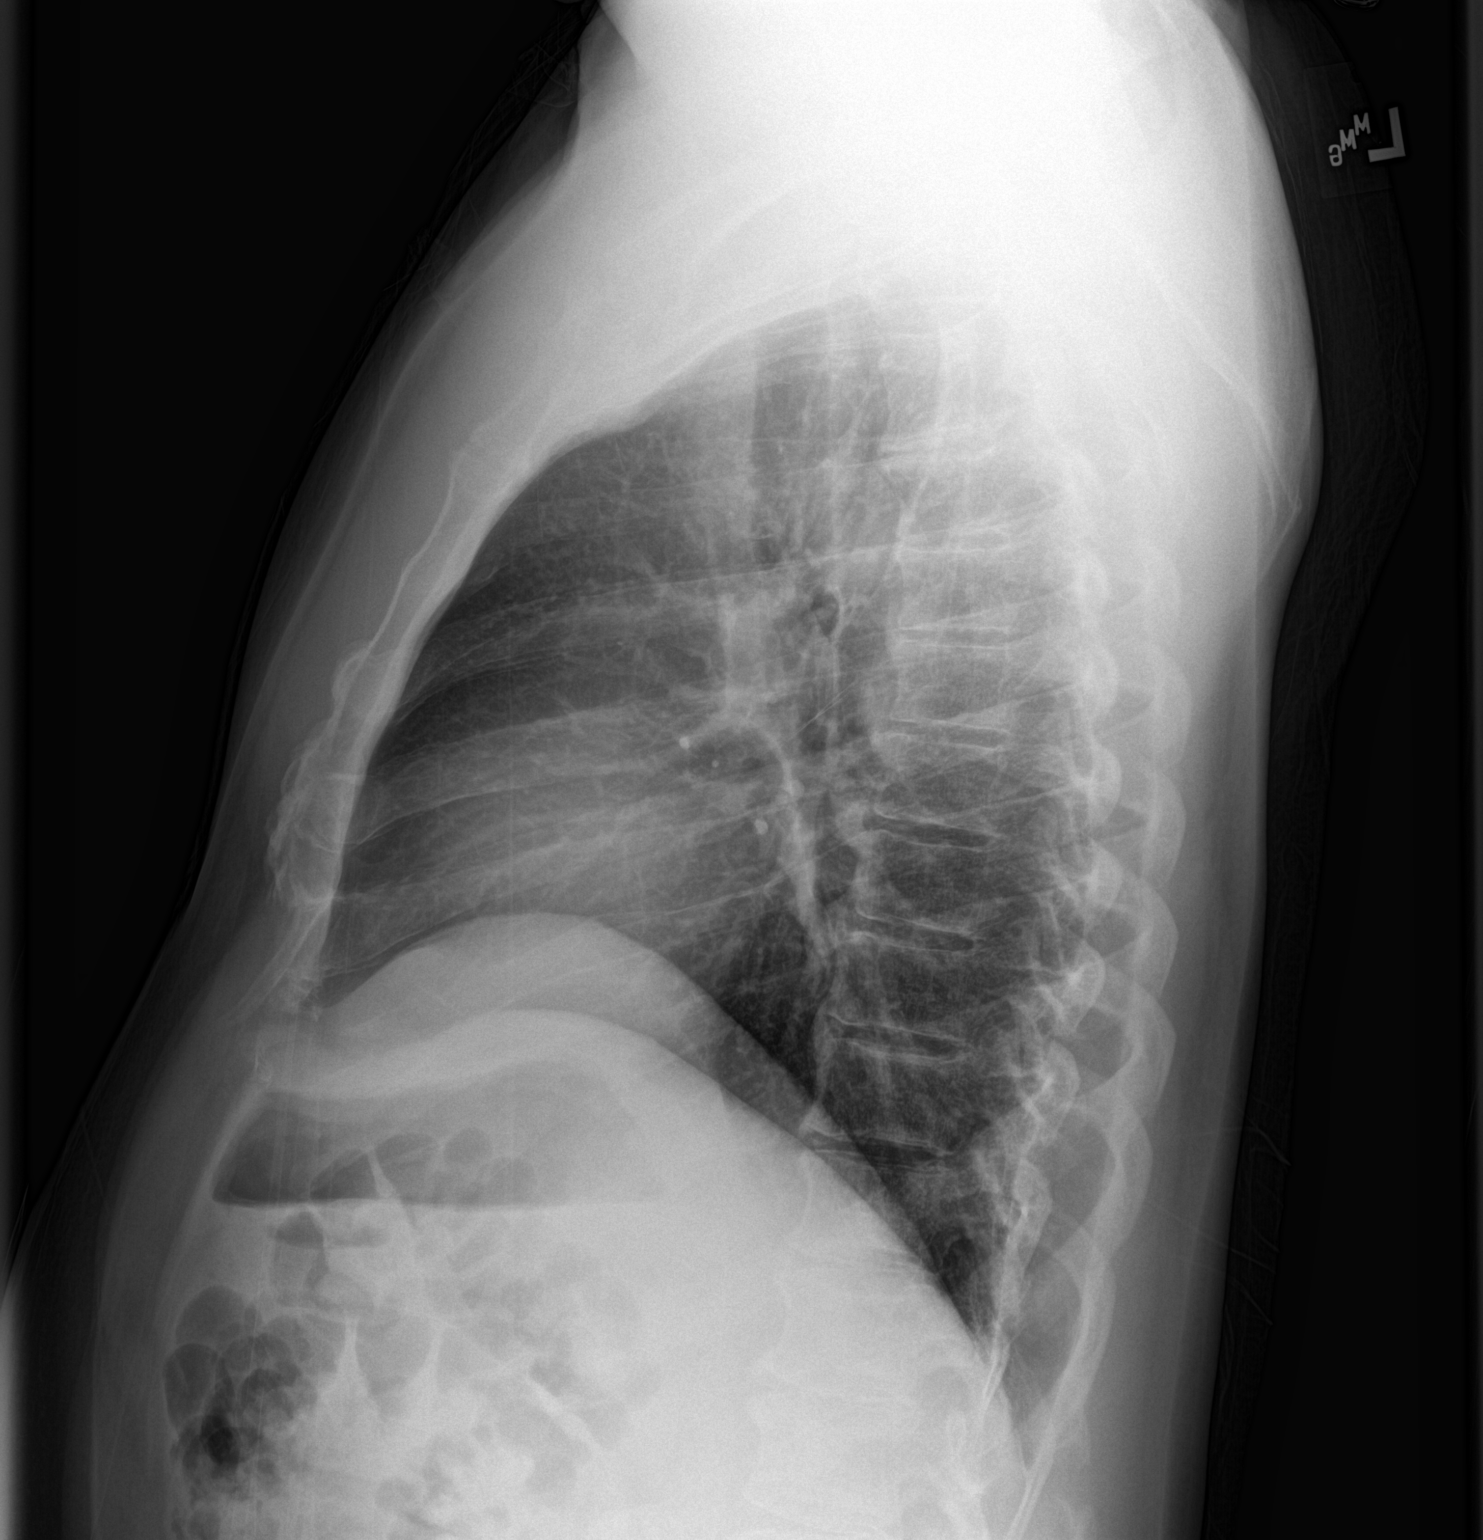

[2 of 2 positions shown; findings below may reference images not displayed]

FINDINGS: The lungs are well-aerated and clear. There is no evidence of focal
opacification, pleural effusion or pneumothorax.

The heart is normal in size; the mediastinal contour is within
normal limits. No acute osseous abnormalities are seen. The patient
is status post right-sided rotator cuff repair.
IMPRESSION: No acute cardiopulmonary process seen.

## 2020-01-15 ENCOUNTER — Ambulatory Visit: Payer: Self-pay

## 2021-11-26 ENCOUNTER — Other Ambulatory Visit: Payer: Self-pay | Admitting: *Deleted

## 2021-11-26 NOTE — Patient Outreach (Signed)
  Care Coordination   11/26/2021  Name: Jared Kline MRN: [id number] DOB: Apr 29, 1960   Care Coordination Outreach Attempts:  An unsuccessful telephone outreach was attempted today to offer the patient information about available care coordination services as a benefit of their health plan. HIPAA compliant messages left on voicemail, providing contact information for CSW, encouraging patient to return CSW's call at his earliest convenience.  Follow Up Plan:  Additional outreach attempts will be made to offer the patient care coordination information and services.   Encounter Outcome:  No Answer.   Care Coordination Interventions Activated:  No.    Care Coordination Interventions:  No, not indicated.    Danford Bad, BSW, MSW, LCSW  Licensed Restaurant manager, [employer]  Mailing [place] [address], [place], [place] [address] Physical Address[address] [place], [place] [address] Toll Free Main # [phone] Fax # [phone] Cell # [phone] [email or web address]

## 2021-12-01 ENCOUNTER — Other Ambulatory Visit: Payer: Self-pay | Admitting: *Deleted

## 2021-12-01 NOTE — Patient Outreach (Signed)
  Care Coordination   12/01/2021  Name: Hargis Vandyne MRN: [id number] DOB: [birth year]   Care Coordination Outreach Attempts:  A second unsuccessful outreach was attempted today to offer the patient with information about available care coordination services as a benefit of their health plan.   HIPAA compliant messages left on voicemail, providing contact information for CSW, encouraging patient to return CSW's call at his earliest convenience.  Follow Up Plan:  Additional outreach attempts will be made to offer the patient care coordination information and services.   Encounter Outcome:  No Answer.   Care Coordination Interventions Activated:  No.    Care Coordination Interventions:  No, not indicated.    Danford Bad, BSW, MSW, LCSW  Licensed Restaurant manager, [employer]  Mailing [place] [address], [place], [place] [address] Physical Address[address], [place], [place] [address] Toll Free Main # [phone] Fax # [phone] Cell # [phone] [email or web address]

## 2021-12-07 ENCOUNTER — Other Ambulatory Visit: Payer: Self-pay | Admitting: *Deleted

## 2021-12-07 NOTE — Patient Outreach (Signed)
  Care Coordination   12/07/2021  Name: Marlow Berenguer MRN: [id number] DOB: [birth year]   Care Coordination Outreach Attempts:  A third unsuccessful outreach was attempted today to offer the patient with information about available care coordination services as a benefit of their health plan. HIPAA compliant messages left on voicemail, providing contact information for CSW, encouraging patient to return CSW's call at his earliest convenience.  Follow Up Plan:  No further outreach attempts will be made at this time. We have been unable to contact the patient to offer or enroll patient in care coordination services.  Encounter Outcome:  No Answer.   Care Coordination Interventions:  No, not indicated.    Danford Bad, BSW, MSW, LCSW  Licensed Restaurant manager, [employer] [facility]  Mailing [place] [address], [place], [place] [address] Physical Address[address] [place], [place] [address] Toll Free Main # [phone] Fax # [phone] Cell # [phone] [email or web address]

## 2022-05-15 DIAGNOSIS — I10 Essential (primary) hypertension: Secondary | ICD-10-CM | POA: Diagnosis not present

## 2022-05-15 DIAGNOSIS — H9311 Tinnitus, right ear: Secondary | ICD-10-CM | POA: Diagnosis not present
